# Patient Record
Sex: Female | Born: 1945 | Race: White | Hispanic: No | Marital: Married
Health system: Southern US, Community
[De-identification: ages and names within clinical notes are randomized; demographics above are authoritative.]

---

## 2004-07-14 ENCOUNTER — Ambulatory Visit: Payer: Self-pay | Admitting: Family Medicine

## 2005-08-11 ENCOUNTER — Ambulatory Visit: Payer: Self-pay | Admitting: Family Medicine

## 2006-10-19 ENCOUNTER — Ambulatory Visit: Payer: Self-pay | Admitting: Family Medicine

## 2007-10-20 ENCOUNTER — Ambulatory Visit: Payer: Self-pay | Admitting: Family Medicine

## 2008-01-25 ENCOUNTER — Ambulatory Visit: Payer: Self-pay | Admitting: Family Medicine

## 2008-07-01 ENCOUNTER — Ambulatory Visit: Payer: Self-pay | Admitting: Family Medicine

## 2008-10-23 ENCOUNTER — Ambulatory Visit: Payer: Self-pay | Admitting: Family Medicine

## 2009-10-24 ENCOUNTER — Ambulatory Visit: Payer: Self-pay | Admitting: Family Medicine

## 2009-12-08 ENCOUNTER — Ambulatory Visit: Payer: Self-pay | Admitting: Unknown Physician Specialty

## 2009-12-10 LAB — PATHOLOGY REPORT

## 2010-06-03 ENCOUNTER — Ambulatory Visit: Payer: Self-pay | Admitting: Family Medicine

## 2011-01-19 ENCOUNTER — Ambulatory Visit: Payer: Self-pay | Admitting: Family Medicine

## 2012-03-29 ENCOUNTER — Ambulatory Visit: Payer: Self-pay | Admitting: Family Medicine

## 2012-05-20 ENCOUNTER — Emergency Department: Payer: Self-pay | Admitting: Emergency Medicine

## 2012-11-01 ENCOUNTER — Ambulatory Visit: Payer: Self-pay | Admitting: Family Medicine

## 2013-04-10 ENCOUNTER — Ambulatory Visit: Payer: Self-pay | Admitting: Family Medicine

## 2013-04-17 ENCOUNTER — Ambulatory Visit: Payer: Self-pay | Admitting: Family Medicine

## 2015-02-06 ENCOUNTER — Other Ambulatory Visit: Payer: Self-pay | Admitting: Family Medicine

## 2015-02-06 NOTE — Telephone Encounter (Signed)
05/21/13 last ov

## 2015-03-09 ENCOUNTER — Other Ambulatory Visit: Payer: Self-pay | Admitting: Family Medicine

## 2015-03-10 NOTE — Telephone Encounter (Signed)
This was refilled on 02/06/15. Last ov 05/21/13
